# Patient Record
Sex: Female | Born: 1955 | Race: Black or African American | Hispanic: No | Marital: Single | Smoking: Never smoker
Health system: Southern US, Community
[De-identification: ages and names within clinical notes are randomized; demographics above are authoritative.]

## PROBLEM LIST (undated history)

## (undated) DIAGNOSIS — I10 Essential (primary) hypertension: Secondary | ICD-10-CM

## (undated) DIAGNOSIS — L509 Urticaria, unspecified: Secondary | ICD-10-CM

## (undated) DIAGNOSIS — C801 Malignant (primary) neoplasm, unspecified: Secondary | ICD-10-CM

## (undated) HISTORY — DX: Urticaria, unspecified: L50.9

## (undated) HISTORY — PX: BREAST SURGERY: SHX581

---

## 1998-04-29 ENCOUNTER — Other Ambulatory Visit: Admission: RE | Admit: 1998-04-29 | Discharge: 1998-04-29 | Payer: Self-pay | Admitting: Hematology and Oncology

## 2000-03-04 ENCOUNTER — Other Ambulatory Visit: Admission: RE | Admit: 2000-03-04 | Discharge: 2000-03-04 | Payer: Self-pay | Admitting: Family Medicine

## 2000-09-12 ENCOUNTER — Encounter: Admission: RE | Admit: 2000-09-12 | Discharge: 2000-09-12 | Payer: Self-pay | Admitting: Hematology and Oncology

## 2000-09-12 ENCOUNTER — Encounter: Payer: Self-pay | Admitting: Hematology and Oncology

## 2002-09-09 ENCOUNTER — Emergency Department (HOSPITAL_COMMUNITY): Admission: EM | Admit: 2002-09-09 | Discharge: 2002-09-09 | Payer: Self-pay | Admitting: *Deleted

## 2002-09-09 ENCOUNTER — Encounter: Payer: Self-pay | Admitting: Emergency Medicine

## 2002-11-29 ENCOUNTER — Other Ambulatory Visit: Admission: RE | Admit: 2002-11-29 | Discharge: 2002-11-29 | Payer: Self-pay | Admitting: Obstetrics and Gynecology

## 2005-10-15 ENCOUNTER — Other Ambulatory Visit: Admission: RE | Admit: 2005-10-15 | Discharge: 2005-10-15 | Payer: Self-pay | Admitting: Obstetrics and Gynecology

## 2007-07-06 ENCOUNTER — Encounter: Admission: RE | Admit: 2007-07-06 | Discharge: 2007-07-06 | Payer: Self-pay | Admitting: Obstetrics and Gynecology

## 2007-07-10 ENCOUNTER — Ambulatory Visit (HOSPITAL_BASED_OUTPATIENT_CLINIC_OR_DEPARTMENT_OTHER): Admission: RE | Admit: 2007-07-10 | Discharge: 2007-07-11 | Payer: Self-pay | Admitting: Specialist

## 2007-07-10 ENCOUNTER — Encounter (INDEPENDENT_AMBULATORY_CARE_PROVIDER_SITE_OTHER): Payer: Self-pay | Admitting: Specialist

## 2007-11-02 ENCOUNTER — Emergency Department (HOSPITAL_COMMUNITY): Admission: EM | Admit: 2007-11-02 | Discharge: 2007-11-02 | Payer: Self-pay | Admitting: Emergency Medicine

## 2009-09-08 ENCOUNTER — Emergency Department (HOSPITAL_COMMUNITY): Admission: EM | Admit: 2009-09-08 | Discharge: 2009-09-08 | Payer: Self-pay | Admitting: Emergency Medicine

## 2009-11-12 ENCOUNTER — Ambulatory Visit (HOSPITAL_COMMUNITY): Admission: RE | Admit: 2009-11-12 | Discharge: 2009-11-12 | Payer: Self-pay | Admitting: General Surgery

## 2010-08-10 IMAGING — RF DG CHOLANGIOGRAM OPERATIVE
1 series · 9 of 9 positions shown · non-contrast
Comparison: None

CLINICAL DATA: Cholelithiasis

INTRAOPERATIVE CHOLANGIOGRAM
TECHNIQUE: Cholangiographic images from the C-arm fluoroscopic
device were submitted for interpretation post-operatively.  Please
see the procedural report for the amount of contrast and the
fluoroscopy time utilized.

[Series 1: run · 3 acquisitions, 9 frames shown]
[im 1/3]
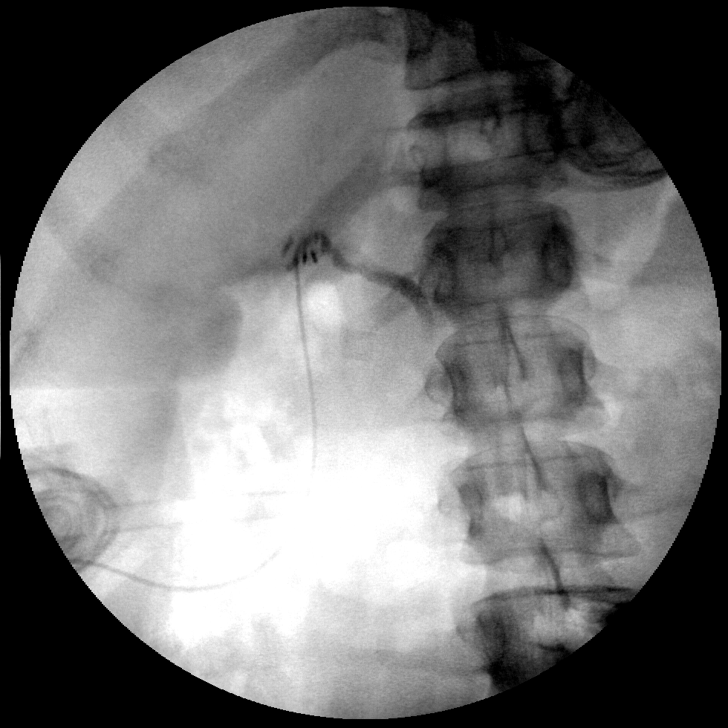
[im 1/3]
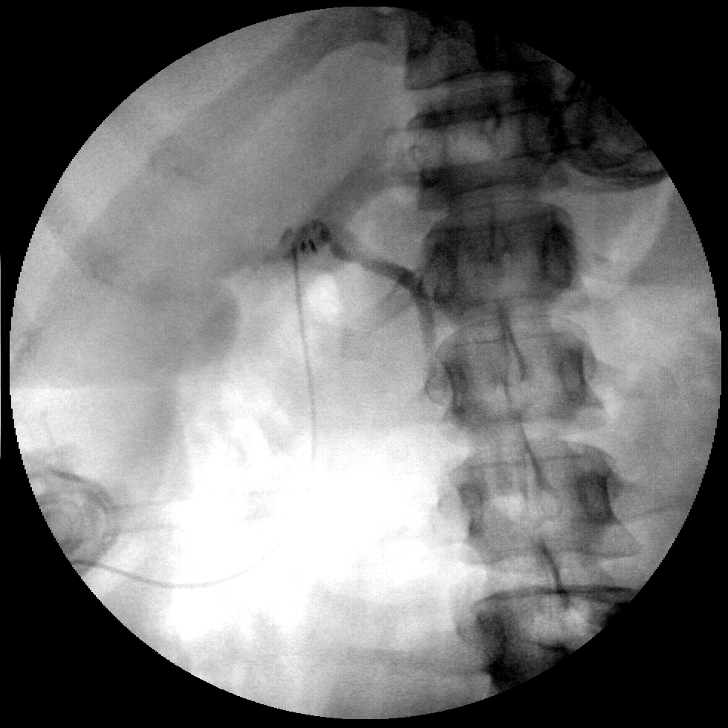
[im 1/3]
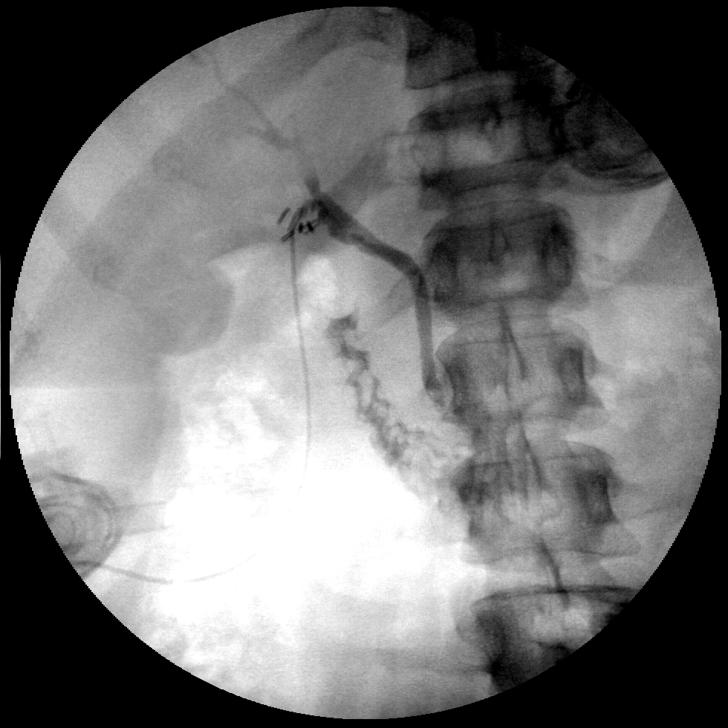
[im 1/3]
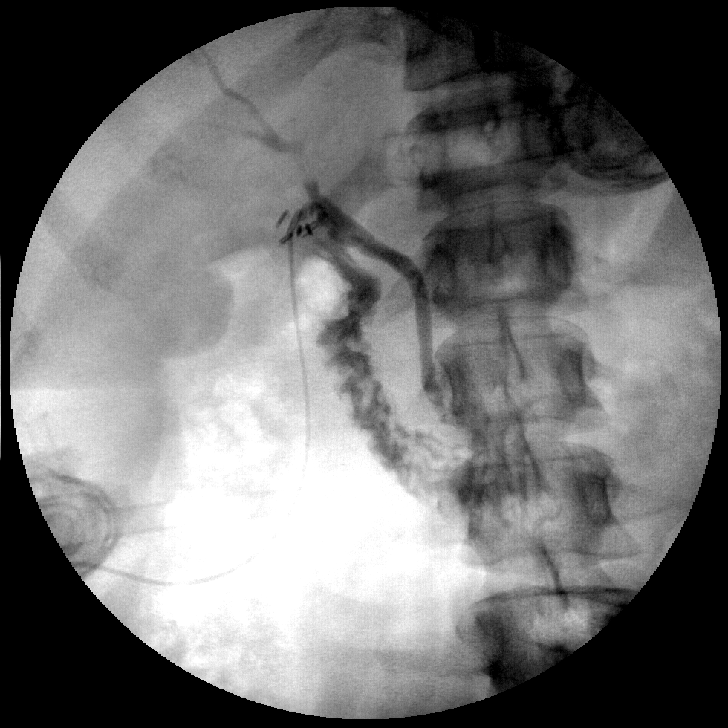
[im 2/3]
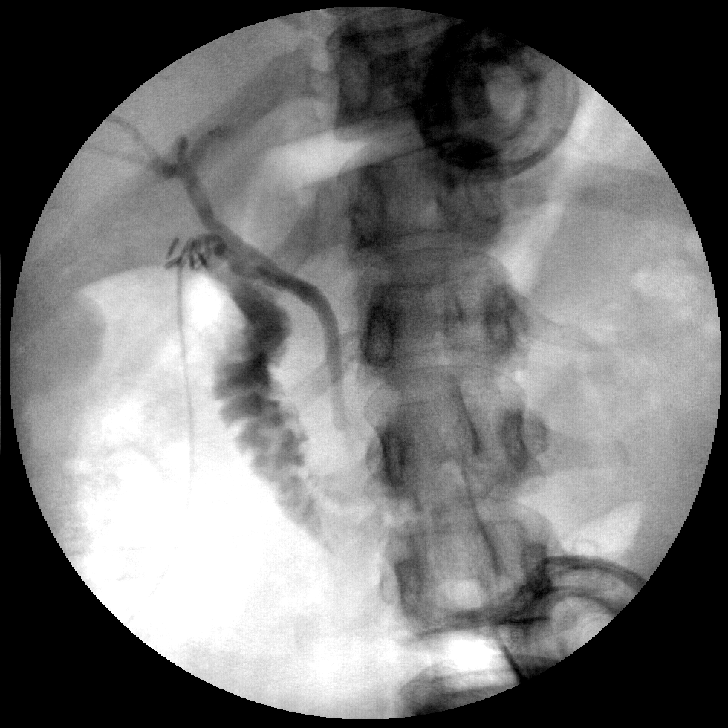
[im 2/3]
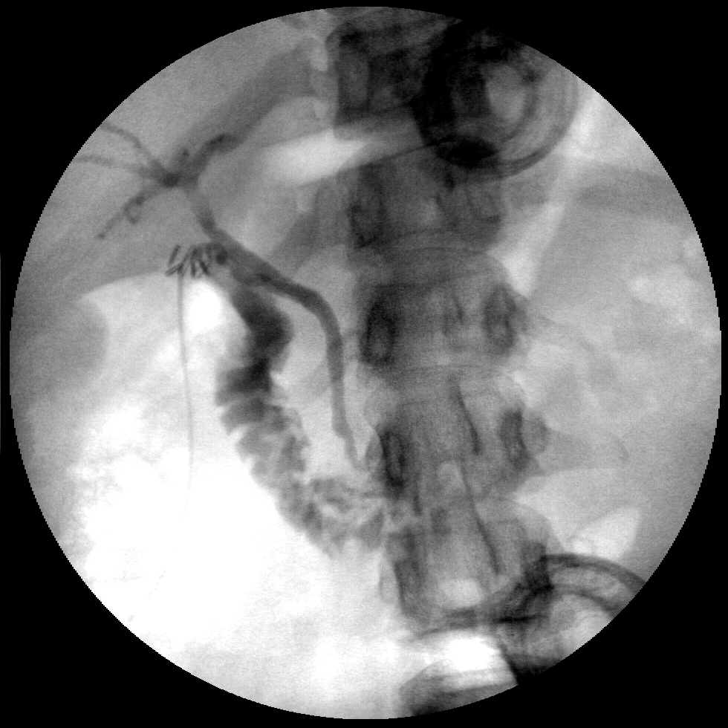
[im 2/3]
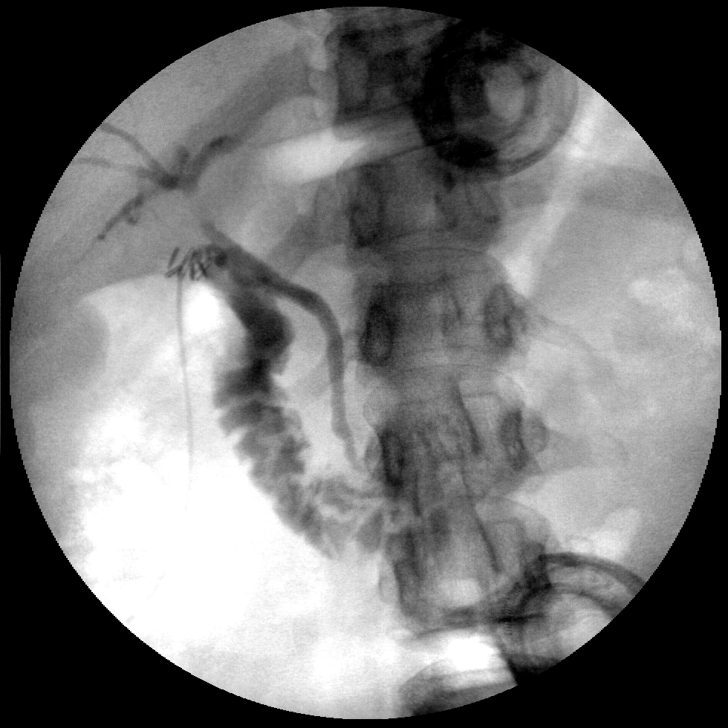
[im 2/3]
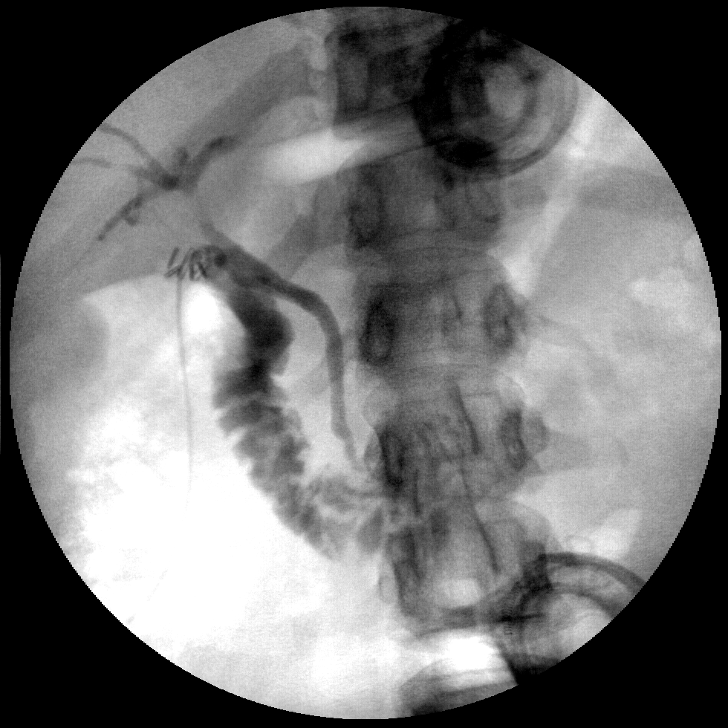
[im 3/3]
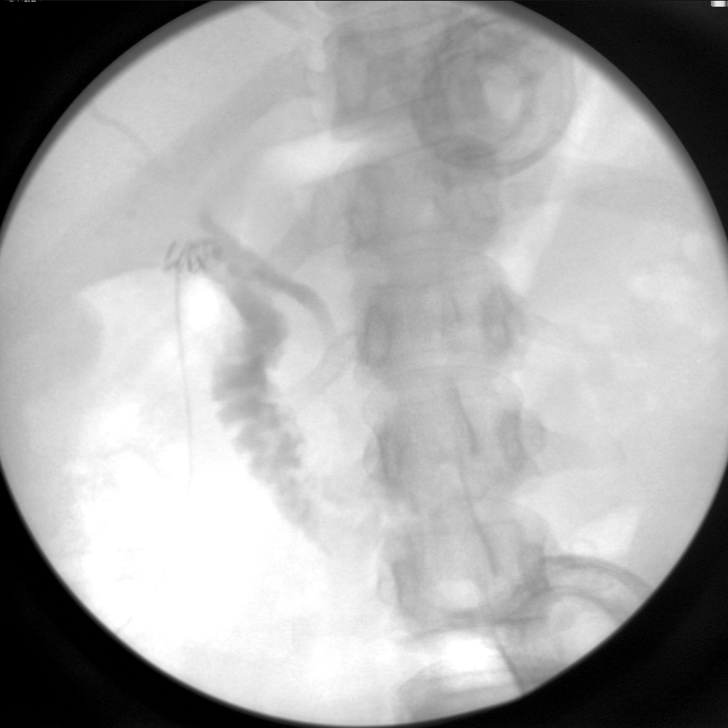

[9 of 9 positions shown; findings below may reference images not displayed]

FINDINGS: No persistent filling defects in the common duct.
Intrahepatic ducts are incompletely visualized, appearing
decompressed centrally. Contrast passes into the duodenum.

IMPRESSION

Negative for retained common duct stone.

## 2010-08-10 IMAGING — CR DG CHEST 2V
2 series · 2 of 2 positions shown · non-contrast
Comparison: 11/02/2007.

CLINICAL DATA: Preop for gallstones.

CHEST - 2 VIEW

[w chest pa]
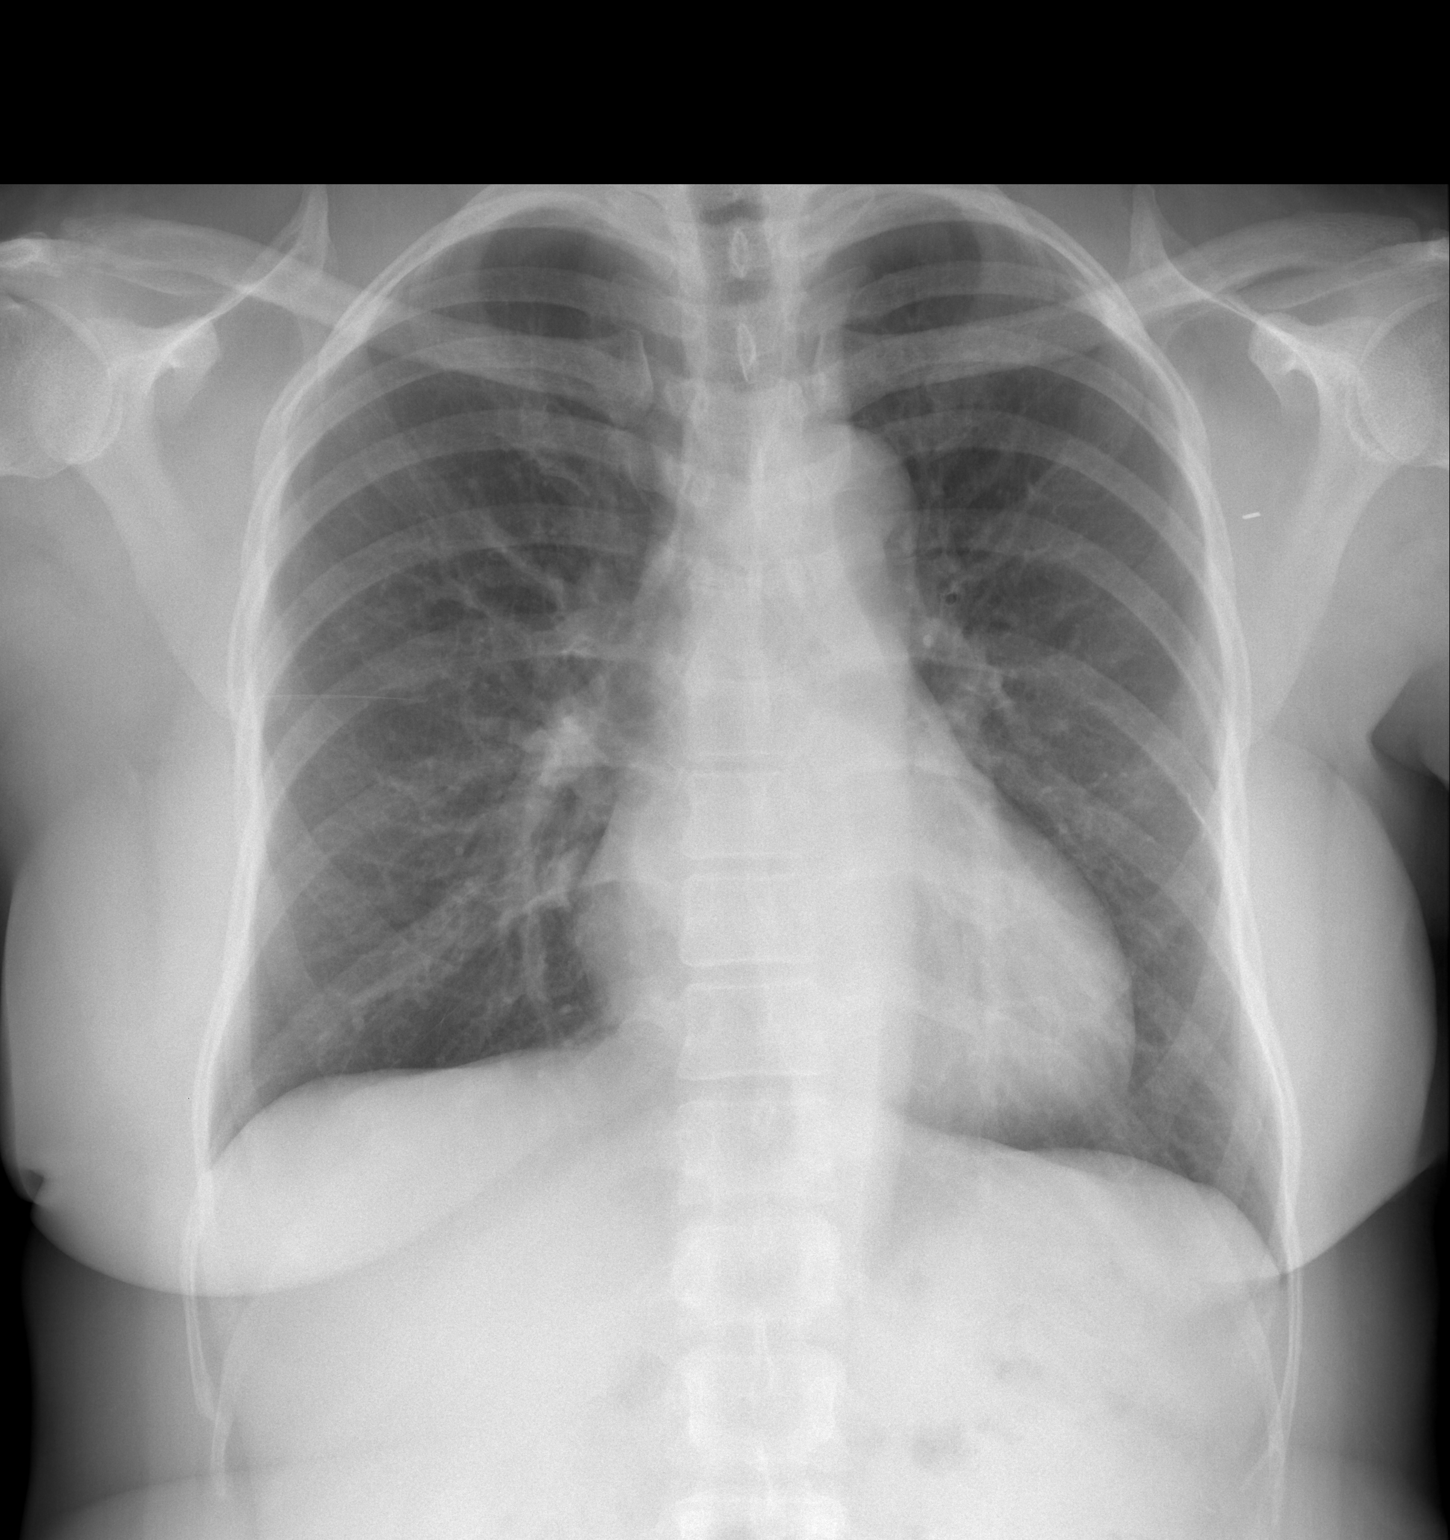

[w chest lat]
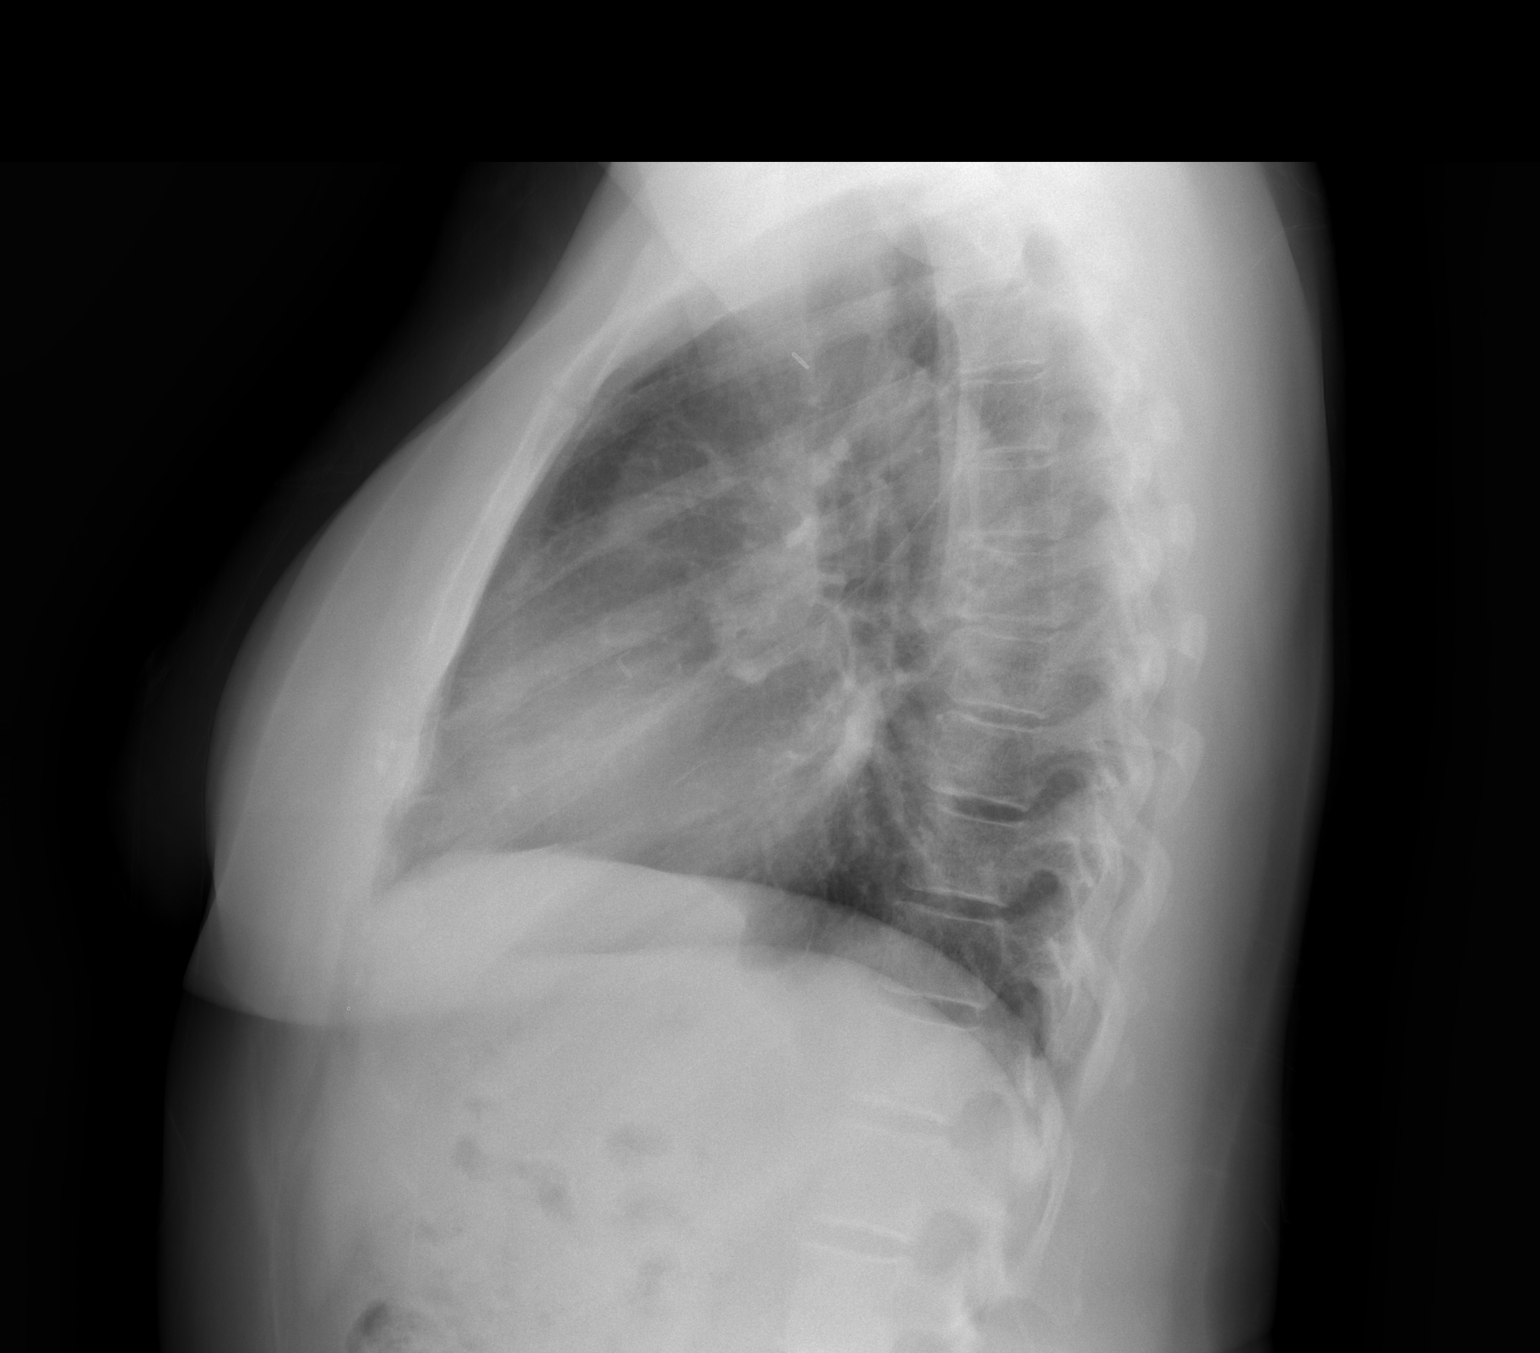

[2 of 2 positions shown; findings below may reference images not displayed]

FINDINGS: Heart size upper normal.  Lungs clear.  No pleural fluid
or osseous lesions.
IMPRESSION: The heart size upper normal - no active disease.

## 2011-03-24 LAB — COMPREHENSIVE METABOLIC PANEL
AST: 17 U/L (ref 0–37)
Albumin: 3.5 g/dL (ref 3.5–5.2)
Alkaline Phosphatase: 109 U/L (ref 39–117)
Chloride: 106 mEq/L (ref 96–112)
GFR calc non Af Amer: >60 mL/min (ref >60)
Glucose, Bld: 106 mg/dL — ABNORMAL HIGH (ref 70–99)
Total Protein: 6.9 g/dL (ref 6.0–8.3)

## 2011-03-24 LAB — DIFFERENTIAL
Basophils Absolute: 0 10*3/uL (ref 0.0–0.1)
Basophils Relative: 1 % (ref 0–1)
Eosinophils Absolute: 0.1 10*3/uL (ref 0.0–0.7)
Eosinophils Relative: 2 % (ref 0–5)
Lymphocytes Relative: 34 % (ref 12–46)
Lymphs Abs: 1.7 10*3/uL (ref 0.7–4.0)
Monocytes Absolute: 0.3 10*3/uL (ref 0.1–1.0)
Monocytes Relative: 6 % (ref 3–12)

## 2011-03-24 LAB — CBC
Hemoglobin: 11.1 g/dL — ABNORMAL LOW (ref 12.0–15.0)
MCV: 83.5 fL (ref 78.0–100.0)
RBC: 4.06 MIL/uL (ref 3.87–5.11)
RDW: 13.5 % (ref 11.5–15.5)

## 2011-03-26 LAB — DIFFERENTIAL
Basophils Absolute: 0 10*3/uL (ref 0.0–0.1)
Eosinophils Absolute: 0 10*3/uL (ref 0.0–0.7)
Eosinophils Relative: 0 % (ref 0–5)
Lymphs Abs: 1.3 10*3/uL (ref 0.7–4.0)
Monocytes Absolute: 0.4 10*3/uL (ref 0.1–1.0)
Monocytes Relative: 4 % (ref 3–12)

## 2011-03-26 LAB — COMPREHENSIVE METABOLIC PANEL
AST: 124 U/L — ABNORMAL HIGH (ref 0–37)
CO2: 28 mEq/L (ref 19–32)
GFR calc Af Amer: >60 mL/min (ref >60)
Glucose, Bld: 111 mg/dL — ABNORMAL HIGH (ref 70–99)
Sodium: 141 mEq/L (ref 135–145)
Total Protein: 7.2 g/dL (ref 6.0–8.3)

## 2011-03-26 LAB — POCT CARDIAC MARKERS: Myoglobin, poc: 55.5 ng/mL (ref 12–200)

## 2011-03-26 LAB — CBC
HCT: 34.6 % — ABNORMAL LOW (ref 36.0–46.0)
MCV: 85.1 fL (ref 78.0–100.0)

## 2011-03-26 LAB — LIPASE, BLOOD: Lipase: 41 U/L (ref 11–59)

## 2011-05-04 NOTE — Op Note (Signed)
NAME:  Kristin Galloway, Kristin Galloway             ACCOUNT NO.:  [id number]   MEDICAL RECORD NO.:  [id number]          PATIENT TYPE:  AMB   LOCATION:  DSC                          FACILITY:  [facility]   PHYSICIAN:  Earvin Hansen L. [place]es[place], M.D.DATE OF BIRTH:  [birth year]   DATE OF PROCEDURE:  DATE OF DISCHARGE:                               OPERATIVE REPORT   HISTORY:  The patient is 55 years old, status post left mastectomy for  breast cancer approximately 10 years ago and received postop  irradiation, as well as chemotherapy.  The patient over the last few  months has consulted me in regards to reconstruction on the left side.  The mastectomy area has healed well without any problems.  The skin is  actually very supple compared to most skin that has had the irradiation.  The right breast shows increased macromastia and drooping.   PROCEDURES:  1. Lifting and reduction of the right breast with reduction      mammoplasty.  2. Reconstruction of the left mastectomy area with a tissue expander.   ANESTHESIA:  General.   OPERATIVE PROCEDURE:  Preoperatively, the patient was set up and drawn  for the right reduction using inferior pedicle markings and the left  tissue expander using inframammary fold congruent with the right side.  She then underwent general anesthesia, intubated orally.  Prep was done  to the chest breast areas in a routine fashion using Hibiclens soap and  solution and walled off with sterile towels and drapes, so as to make a  sterile field.  1/4% Xylocaine with epinephrine was injected locally in  the right breast area for vasoconstriction.  The wounds were scored with  #15 blade and then the skin over the inferior pedicle was de-epithelized  with #2 blades.  Medial and lateral fascia, derma and pedicles were  excised.  A new keyhole area was also excised and flaps were transposed  and stayed with 3-0 Prolene.  This gave excellent symmetry and also we  removed some accessory breast tissue  in the right lateral portion that  caused a fold area preoperatively.  After this, the wounds were closed  in layers with 2-0 Monocryl x2 layers.  A running subcuticular stitch of  3-0 Monocryl and 5-0 Monocryl throughout the inverted T.  The wound was  drained with a #10 Blake drain fully floated, which was placed in the  depths of the wound and brought out through the lateral most portion of  the incision and secured with 3-0 Prolene.  Next, attention was drawn to  the left side.  Previous transverse incision was opened.  After the area  was anesthetized with 1/4% Xylocaine with epinephrine 100 mL, I was able  to dissect down to the pectoralis major muscle.  A subpectoral plane was  then developed.  Using a finger dissection, I was able to dissect the  superior and medial portions; however, distally we used light retraction  and dissected over the 5th rib to go down to the previously drawn  inframammary fold and out laterally.  We were able to remove scar tissue  and dissect out  the whole space.  The pocket was examined.  Hemostasis  was maintained with the Bovie unit of coagulation.  Next, a Mentor  Spectrum tissue expander was placed into the space with good symmetry  and approximately 150 mL of saline was injected.  We also put a port in  laterally at the axillary region and coupled it together with the tubing  from the tissue expander.  This was sutured in with 3-0 Monocryl.  Next,  after proper hemostasis, the muscle was then re-closed with 2-0  Monocryl, subcutaneous tissue with 3-0 Monocryl x2 layers, then a  running subcuticular stitch of 3-0 Monocryl.  The wounds were drained  with a #10 floated Blake drain, which was placed in the depths of the  wound and brought out laterally and secured with 3-0 Prolene.  The  wounds were cleansed.  Steri-Strips were applied, 1/2-inch type soft  dressings to each side.  She withstood the procedures very well and was  taken to recovery in  excellent condition.   ESTIMATED BLOOD LOSS:  Less than 100 mL.   COMPLICATIONS:  None.      Yaakov Guthrie. Shon Hough, M.D.  Electronically Signed     GLT/MEDQ  D:  07/10/2007  T:  07/10/2007  Job:  [id number]

## 2011-09-28 LAB — URINALYSIS, ROUTINE W REFLEX MICROSCOPIC
Bilirubin Urine: NEGATIVE
Glucose, UA: NEGATIVE
Nitrite: NEGATIVE
pH: 7.5

## 2011-09-28 LAB — URINE MICROSCOPIC-ADD ON

## 2011-09-28 LAB — POCT URINE HEMOGLOBIN: Hgb urine dipstick: POSITIVE — AB

## 2011-10-04 LAB — I-STAT 8, (EC8 V) (CONVERTED LAB)
Acid-Base Excess: 3 — ABNORMAL HIGH
Bicarbonate: 29 — ABNORMAL HIGH
Glucose, Bld: 97
HCT: 39
Hemoglobin: 13.3
Operator id: 123881
Sodium: 142
pH, Ven: 7.395 — ABNORMAL HIGH

## 2011-10-04 LAB — BASIC METABOLIC PANEL
BUN: 15
Chloride: 105
Sodium: 140

## 2011-10-04 LAB — POCT HEMOGLOBIN-HEMACUE: Operator id: 123881

## 2014-02-08 ENCOUNTER — Emergency Department (HOSPITAL_COMMUNITY)
Admission: EM | Admit: 2014-02-08 | Discharge: 2014-02-08 | Disposition: A | Discharge status: Home / Self Care | Payer: BC Managed Care – PPO | Source: Home / Self Care | Attending: Family Medicine | Admitting: Family Medicine

## 2014-02-08 ENCOUNTER — Encounter (HOSPITAL_COMMUNITY): Payer: Self-pay | Admitting: Emergency Medicine

## 2014-02-08 DIAGNOSIS — R062 Wheezing: Secondary | ICD-10-CM

## 2014-02-08 DIAGNOSIS — T7840XA Allergy, unspecified, initial encounter: Secondary | ICD-10-CM

## 2014-02-08 HISTORY — DX: Essential (primary) hypertension: I10

## 2014-02-08 HISTORY — DX: Malignant (primary) neoplasm, unspecified: C80.1

## 2014-02-08 MED ORDER — IPRATROPIUM-ALBUTEROL 0.5-2.5 (3) MG/3ML IN SOLN
3.0000 mL | Freq: Once | RESPIRATORY_TRACT | Status: AC
  Administered 2014-02-08: 3 mL via RESPIRATORY_TRACT

## 2014-02-08 MED ORDER — METHYLPREDNISOLONE ACETATE 80 MG/ML IJ SUSP
80.0000 mg | Freq: Once | INTRAMUSCULAR | Status: AC
  Administered 2014-02-08: 80 mg via INTRAMUSCULAR

## 2014-02-08 MED ORDER — METHYLPREDNISOLONE ACETATE 80 MG/ML IJ SUSP
INTRAMUSCULAR | Status: AC
  Filled 2014-02-08: qty 1

## 2014-02-08 MED ORDER — ALBUTEROL SULFATE HFA 108 (90 BASE) MCG/ACT IN AERS: 1.0000 | INHALATION_SPRAY | Freq: Four times a day (QID) | RESPIRATORY_TRACT | Status: AC | PRN

## 2014-02-08 MED ORDER — IPRATROPIUM-ALBUTEROL 0.5-2.5 (3) MG/3ML IN SOLN
RESPIRATORY_TRACT | Status: AC
  Filled 2014-02-08: qty 3

## 2014-02-08 NOTE — Discharge Instructions (Signed)
Drug Allergy Allergic reactions to medicines are common. Some allergic reactions are mild. A delayed type of drug allergy that occurs 1 week or more after exposure to a medicine or vaccine is called serum sickness. A life-threatening, sudden (acute) allergic reaction that involves the whole body is called anaphylaxis. CAUSES  "True" drug allergies occur when there is an allergic reaction to a medicine. This is caused by overactivity of the immune system. First, the body becomes sensitized. The immune system is triggered by your first exposure to the medicine. Following this first exposure, future exposure to the same medicine may be life-threatening. Almost any medicine can cause an allergic reaction. Common ones are:  Penicillin.  Sulfonamides (sulfa drugs).  Local anesthetics.  X-ray dyes that contain iodine. SYMPTOMS  Common symptoms of a minor allergic reaction are:  Swelling around the mouth.  An itchy red rash or hives.  Vomiting or diarrhea. Anaphylaxis can cause swelling of the mouth and throat. This makes it difficult to breathe and swallow. Severe reactions can be fatal within seconds, even after exposure to only a trace amount of the drug that causes the reaction. HOME CARE INSTRUCTIONS   If you are unsure of what caused your reaction, keep a diary of foods and medicines used. Include the symptoms that followed. Avoid anything that causes reactions.  You may want to follow up with an allergy specialist after the reaction has cleared in order to be tested to confirm the allergy. It is important to confirm that your reaction is an allergy, not just a side effect to the medicine. If you have a true allergy to a medicine, this may prevent that medicine and related medicines from being given to you when you are very ill.  If you have hives or a rash:  Take medicines as directed by your caregiver.  You may use an over-the-counter antihistamine (diphenhydramine) as  needed.  Apply cold compresses to the skin or take baths in cool water. Avoid hot baths or showers.  If you are severely allergic:  Continuous observation after a severe reaction may be needed. Hospitalization is often required.  Wear a medical alert bracelet or necklace stating your allergy.  You and your family must learn how to use an anaphylaxis kit or give an epinephrine injection to temporarily treat an emergency allergic reaction. If you have had a severe reaction, always carry your epinephrine injection or anaphylaxis kit with you. This can be lifesaving if you have a severe reaction.  Do not drive or perform tasks after treatment until the medicines used to treat your reaction have worn off, or until your caregiver says it is okay. SEEK MEDICAL CARE IF:   You think you had an allergic reaction. Symptoms usually start within 30 minutes after exposure.  Symptoms are getting worse rather than better.  You develop new symptoms.  The symptoms that brought you to your caregiver return. SEEK IMMEDIATE MEDICAL CARE IF:   You have swelling of the mouth, difficulty breathing, or wheezing.  You have a tight feeling in your chest or throat.  You develop hives, swelling, or itching all over your body.  You develop severe vomiting or diarrhea.  You feel faint or pass out. This is an emergency. Use your epinephrine injection or anaphylaxis kit as you have been instructed. Call for emergency medical help. Even if you improve after the injection, you need to be examined at a hospital emergency department. MAKE SURE YOU:   Understand these instructions.  Will watch  your condition.  Will get help right away if you are not doing well or get worse. Document Released: 12/06/2005 Document Revised: 02/28/2012 Document Reviewed: 05/12/2011 [facility] Patient Information 2014 [place], [place].   Take Benadryl every 4 hours $Remov'25mg'BffgCr$ , for the next 24 hours. Steroid will help as well. Use inhaler  every 6 hours for the next 24-36 hours then as needed. F/U with Dr. Criss Rosales

## 2014-02-08 NOTE — ED Notes (Signed)
Allergic reactionx 2 hours: throat tightening, chest tightening, history of the same.  Not sure what caused this episode

## 2014-02-08 NOTE — ED Provider Notes (Signed)
CSN: [id number]     Arrival date & time 02/08/14  1609 History   First MD Initiated Contact with Patient 02/08/14 1658     Chief Complaint  Patient presents with  . Allergic Reaction     (Consider location/radiation/quality/duration/timing/severity/associated sxs/prior Treatment) HPI Comments: Patient presents today with mild dyspnea and hoarseness following a questionable exposure to a food. She has a known history of drug allergy, and she reports at times she has a reaction with certain fumes. She questions what exactly she was exposed to but she started to feel SOB. Denies rash, cough, or dizziness. She has already taken 50mg  or oral Benadryl which she states does help. She denies palpitations.   Patient is a 58 y.o. female presenting with allergic reaction. The history is provided by the patient.  Allergic Reaction   Past Medical History  Diagnosis Date  . Hypertension   . Cancer    Past Surgical History  Procedure Laterality Date  . Breast surgery     No family history on file. History  Substance Use Topics  . Smoking status: Never Smoker   . Smokeless tobacco: Not on file  . Alcohol Use: No   OB History   Grav Para Term Preterm Abortions TAB SAB Ect Mult Living                 Review of Systems  All other systems reviewed and are negative.      Allergies  Other  Home Medications   Current Outpatient Rx  Name  Route  Sig  Dispense  Refill  . diphenhydrAMINE (BENADRYL) 25 MG tablet   Oral   Take 25 mg by mouth every 6 (six) hours as needed.         Marland Kitchen OVER THE COUNTER MEDICATION      Blood pressure medicine         . albuterol (PROVENTIL HFA;VENTOLIN HFA) 108 (90 BASE) MCG/ACT inhaler   Inhalation   Inhale 1-2 puffs into the lungs every 6 (six) hours as needed for wheezing or shortness of breath (Use every 6 hours for 24 hour-36 hours then prn).   1 Inhaler   0    BP 192/98  Pulse 90  Temp(Src) 98.5 F (36.9 C) (Oral)  Resp 18  SpO2  98% Physical Exam  Nursing note and vitals reviewed. Constitutional: She is oriented to person, place, and time. She appears well-developed and well-nourished. No distress.  Patient does not appear in respiratory distress and breathing comfortably. She does have a hoarseness to her voice though making complete sentences.   HENT:  Head: Normocephalic and atraumatic.  Mouth/Throat: No oropharyngeal exudate.  Neck: Normal range of motion. Neck supple. No JVD present. No tracheal deviation present. No thyromegaly present.  Cardiovascular: Normal rate, regular rhythm and normal heart sounds.   Pulmonary/Chest: Effort normal. No stridor. She has wheezes.  Mild basilar wheezing  Neurological: She is alert and oriented to person, place, and time. No cranial nerve deficit.  Skin: Skin is warm and dry. No rash noted. She is not diaphoretic.  Psychiatric: Her behavior is normal. Judgment normal.    ED Course  Procedures (including critical care time) Labs Review Labs Reviewed - No data to display Imaging Review No results found.    MDM   Final diagnoses:  Allergic reaction  Wheezing    Mild, but noted wheeze throughout. Sats stable. Treated acutely with Neb, steroid IM and Benadryl q 4 hours for the next 24-36 hours. Inhaler  q 6 hours for the next 24-36 hours as well.  F/U with PCP.      Bjorn Pippin, PA-C 02/08/14 1730

## 2014-02-10 NOTE — ED Provider Notes (Signed)
Medical screening examination/treatment/procedure(s) were performed by a resident physician or non-physician practitioner and as the supervising physician I was immediately available for consultation/collaboration.  Lynne Leader, MD    Gregor Hams, MD 02/10/14 [phone]

## 2014-08-16 ENCOUNTER — Other Ambulatory Visit: Payer: Self-pay | Admitting: Gastroenterology

## 2018-03-09 ENCOUNTER — Other Ambulatory Visit: Payer: Self-pay | Admitting: Family Medicine

## 2018-03-09 DIAGNOSIS — N6310 Unspecified lump in the right breast, unspecified quadrant: Secondary | ICD-10-CM

## 2018-03-31 ENCOUNTER — Ambulatory Visit
Admission: RE | Admit: 2018-03-31 | Discharge: 2018-03-31 | Disposition: A | Discharge status: Home / Self Care | Payer: BC Managed Care – PPO | Source: Ambulatory Visit | Attending: Family Medicine | Admitting: Family Medicine

## 2018-03-31 ENCOUNTER — Other Ambulatory Visit: Payer: Self-pay | Admitting: Family Medicine

## 2018-03-31 DIAGNOSIS — N6310 Unspecified lump in the right breast, unspecified quadrant: Secondary | ICD-10-CM

## 2018-04-06 ENCOUNTER — Ambulatory Visit
Admission: RE | Admit: 2018-04-06 | Discharge: 2018-04-06 | Disposition: A | Discharge status: Home / Self Care | Payer: BC Managed Care – PPO | Source: Ambulatory Visit | Attending: Family Medicine | Admitting: Family Medicine

## 2018-04-06 DIAGNOSIS — N6310 Unspecified lump in the right breast, unspecified quadrant: Secondary | ICD-10-CM

## 2018-07-04 ENCOUNTER — Other Ambulatory Visit: Payer: Self-pay | Admitting: Family Medicine

## 2018-07-04 DIAGNOSIS — R5381 Other malaise: Secondary | ICD-10-CM

## 2018-07-14 ENCOUNTER — Other Ambulatory Visit: Payer: Self-pay | Admitting: Family Medicine

## 2018-07-14 DIAGNOSIS — R922 Inconclusive mammogram: Secondary | ICD-10-CM

## 2019-05-26 ENCOUNTER — Other Ambulatory Visit: Payer: Self-pay | Admitting: *Deleted

## 2019-05-26 DIAGNOSIS — Z20822 Contact with and (suspected) exposure to covid-19: Secondary | ICD-10-CM

## 2019-05-28 LAB — NOVEL CORONAVIRUS, NAA: SARS-CoV-2, NAA: NOT DETECTED

## 2019-06-30 ENCOUNTER — Other Ambulatory Visit: Payer: Self-pay

## 2019-06-30 DIAGNOSIS — Z20822 Contact with and (suspected) exposure to covid-19: Secondary | ICD-10-CM

## 2019-07-06 LAB — NOVEL CORONAVIRUS, NAA: SARS-CoV-2, NAA: NOT DETECTED

## 2020-02-21 ENCOUNTER — Ambulatory Visit: Payer: BC Managed Care – PPO | Attending: Family

## 2020-02-21 DIAGNOSIS — Z23 Encounter for immunization: Secondary | ICD-10-CM | POA: Insufficient documentation

## 2020-02-21 NOTE — Progress Notes (Signed)
   Covid-19 Vaccination Clinic  Name:  Kristin Galloway    MRN: [id number] DOB: 1956/04/09  02/21/2020  [name] was observed post Covid-19 immunization for 15 minutes without incident. She was provided with Vaccine Information Sheet and instruction to access the V-Safe system.   [name] was instructed to call 911 with any severe reactions post vaccine: Marland Kitchen Difficulty breathing  . Swelling of face and throat  . A fast heartbeat  . A bad rash all over body  . Dizziness and weakness   Immunizations Administered    Name Date Dose VIS Date Route   Moderna COVID-19 Vaccine 02/21/2020  3:13 PM 0.5 mL 11/20/2019 Intramuscular   Manufacturer: Moderna   Lot: [id number]   [id number]

## 2020-03-25 ENCOUNTER — Ambulatory Visit: Payer: BC Managed Care – PPO | Attending: Family

## 2020-03-25 DIAGNOSIS — Z23 Encounter for immunization: Secondary | ICD-10-CM

## 2020-03-25 NOTE — Progress Notes (Signed)
   Covid-19 Vaccination Clinic  Name:  Kailene Stooksbury    MRN: [id number] DOB: [birth year]  03/25/2020  Ms. Artus was observed post Covid-19 immunization for 15 minutes without incident. She was provided with Vaccine Information Sheet and instruction to access the V-Safe system.   Ms. Celestine was instructed to call 911 with any severe reactions post vaccine: Marland Kitchen Difficulty breathing  . Swelling of face and throat  . A fast heartbeat  . A bad rash all over body  . Dizziness and weakness   Immunizations Administered    Name Date Dose VIS Date Route   Moderna COVID-19 Vaccine 03/25/2020  2:32 PM 0.5 mL 11/20/2019 Intramuscular   Manufacturer: Moderna   Lot: [id number]   [place][id number]

## 2020-12-30 ENCOUNTER — Ambulatory Visit: Payer: Self-pay | Attending: Internal Medicine

## 2020-12-30 DIAGNOSIS — Z23 Encounter for immunization: Secondary | ICD-10-CM

## 2020-12-30 NOTE — Progress Notes (Signed)
   Covid-19 Vaccination Clinic  Name:  Kristin Galloway    MRN: [id number] DOB: 08/24/1956  12/30/2020  [name] was observed post Covid-19 immunization for 15 minutes without incident. She was provided with Vaccine Information Sheet and instruction to access the V-Safe system.   [name] was instructed to call 911 with any severe reactions post vaccine: Marland Kitchen Difficulty breathing  . Swelling of face and throat  . A fast heartbeat  . A bad rash all over body  . Dizziness and weakness   Immunizations Administered    Name Date Dose VIS Date Route   Moderna Covid-19 Booster Vaccine 12/30/2020  4:58 PM 0.25 mL 10/08/2020 Intramuscular   Manufacturer: Levan Hurst   Lot: [id number]   [place]: [id number]

## 2021-02-05 ENCOUNTER — Ambulatory Visit (INDEPENDENT_AMBULATORY_CARE_PROVIDER_SITE_OTHER): Payer: BC Managed Care – PPO | Admitting: Allergy

## 2021-02-05 ENCOUNTER — Other Ambulatory Visit: Payer: Self-pay

## 2021-02-05 ENCOUNTER — Encounter: Payer: Self-pay | Admitting: Allergy

## 2021-02-05 VITALS — BP 132/60 | HR 81 | Temp 97.6°F | Resp 14 | Ht 62.0 in | Wt 180.4 lb

## 2021-02-05 DIAGNOSIS — Z8709 Personal history of other diseases of the respiratory system: Secondary | ICD-10-CM | POA: Diagnosis not present

## 2021-02-05 DIAGNOSIS — L508 Other urticaria: Secondary | ICD-10-CM

## 2021-02-05 MED ORDER — CETIRIZINE HCL 10 MG PO TABS: 10.0000 mg | ORAL_TABLET | Freq: Two times a day (BID) | ORAL | 5 refills | Status: AC

## 2021-02-05 MED ORDER — MONTELUKAST SODIUM 10 MG PO TABS: 10.0000 mg | ORAL_TABLET | Freq: Every day | ORAL | 5 refills | Status: AC

## 2021-02-05 MED ORDER — FAMOTIDINE 20 MG PO TABS: 20.0000 mg | ORAL_TABLET | Freq: Two times a day (BID) | ORAL | 5 refills | Status: AC

## 2021-02-05 NOTE — Progress Notes (Addendum)
New Patient Note  RE: Kristin Galloway MRN: [id number] DOB: [birth year] Date of Office Visit: 02/05/2021  Referring provider: No ref. provider found Primary care provider: Lucianne Lei, MD  Chief Complaint: hives  History of present illness: Kristin Galloway is a 65 y.o. female presenting today for evaluation of hives.  She has been having hives since January 2021and occurs daily anywhere on her body.  She has been using neosporin on areas of hives and states the hive will usually be gone within 30 minutes.  Hives do not leave any marks or bruising behind once resolved.  She had noted that her hands and feet do feel tight at times.  No joint aches/pains with the rash.  No preceding illness.  No changes in her medications or diet.  No changes in her soaps/detergents/body products.  No stings.  Has used benadryl as needed.  She was prescribed prednisone to complete a 20 day course of which she has about 8 days left at this time.  She does states the hives on her abdomen are less with the prednisone on board.    She does report fragrances can cause her to have issues with talking and breathing and feeling nauseous.    No history of asthma however she was prescribed albuterol during a respiratory illness.  She states she may only use it if she is having difficulty breathing or shortness of breath mostly from doing an activity.  No concern at this time for seasonal or perennial allergic rhinoconjunctivitis.  She has no history of eczema.  Review of systems: Review of Systems  Constitutional: Negative.   HENT: Negative.   Eyes: Negative.   Respiratory: Negative.   Cardiovascular: Negative.   Gastrointestinal: Negative.   Musculoskeletal: Negative.   Skin: Positive for itching and rash.  Neurological: Negative.     All other systems negative unless noted above in HPI  Past medical history: Past Medical History:  Diagnosis Date  . Cancer ([place])   . Hypertension   . Urticaria      Past surgical history: Past Surgical History:  Procedure Laterality Date  . BREAST SURGERY      Family history:  Family History  Problem Relation Age of Onset  . Urticaria Sister   . Thyroid disease Sister     Social history: She lives in a home with carpeting with heat pump heating and central cooling.  No pets in the home.  There is no concern for water damage, mildew or roaches in the home.  She is a Scientist, physiological.  She denies a smoking history.  Medication List: Current Outpatient Medications  Medication Sig Dispense Refill  . albuterol (PROVENTIL HFA;VENTOLIN HFA) 108 (90 BASE) MCG/ACT inhaler Inhale 1-2 puffs into the lungs every 6 (six) hours as needed for wheezing or shortness of breath (Use every 6 hours for 24 hour-36 hours then prn). 1 Inhaler 0  . amlodipine-olmesartan (AZOR) 10-20 MG tablet Take 1 tablet by mouth daily.    . diphenhydrAMINE (BENADRYL) 25 MG tablet Take 25 mg by mouth every 6 (six) hours as needed.    Marland Kitchen OVER THE COUNTER MEDICATION Blood pressure medicine     No current facility-administered medications for this visit.    Known medication allergies: Allergies  Allergen Reactions  . Other     Not sure: some foods pollutants     Physical examination: Blood pressure 132/60, pulse 81, temperature 97.6 F (36.4 C), resp. rate 14, height 5\' 2"  (1.575 m), weight 180  lb 6.4 oz (81.8 kg), SpO2 100 %.  General: Alert, interactive, in no acute distress. HEENT: PERRLA, TMs pearly gray, turbinates non-edematous without discharge, post-pharynx non erythematous. Neck: Supple without lymphadenopathy. Lungs: Clear to auscultation without wheezing, rhonchi or rales. {no increased work of breathing. CV: Normal S1, S2 without murmurs. Abdomen: Nondistended, nontender. Skin: Scattered erythematous urticarial type lesions primarily located In the antecubital fossa area of the right arm. , nonvesicular. Extremities:  No clubbing, cyanosis or  edema. Neuro:   Grossly intact.  Diagnositics/Labs: Positive for dermatographia.  Patient scratched her arm in a welt developed at the stroke site  Spirometry: FEV1 1.4L 78%, FVC 1.57L 68% predicted. Restrictive appearing pattern   Assessment and plan:   Chronic urticaria - at this time etiology of hives and swelling is unknown but most likely spontaneous in nature.  Hives can be caused by a variety of different triggers including illness/infection, foods, medications, stings, exercise, pressure, vibrations, extremes of temperature to name a few however majority of the time there is no identifiable trigger.  Your symptoms have been ongoing for >6 weeks making this chronic thus will obtain labwork to evaluate: CBC w diff, CMP, tryptase, hive panel, environmental panel, alpha-gal panel, inflammatory markers - for hive management recommend the following high-dose antihistamine regimen: Zyrtec 10mg  or Allegra 180mg  1 tab twice a day with Pepcid 20mg  1 tab twice a day and Singulair 10mg  1 tab daily at bedtime -if this high-dose antihistamine regimen is not effective enough then recommend starting Xolair monthly injections to help control hives not responsive enough to antihistamines. We will discuss this further if needed at future visit.   History of bronchitis/viral illness -have access to albuterol inhaler 2 puffs every 4-6 hours as needed for cough/wheeze/shortness of breath/chest tightness.  May use 15-20 minutes prior to activity.   Monitor frequency of use.     Follow-up in 2-3 months or sooner if needed  I appreciate the opportunity to take part in Aalaiyah's care. Please do not hesitate to contact me with questions.  Sincerely,   Prudy Feeler, MD Allergy/Immunology [facility] [place] [facility] [place]

## 2021-02-05 NOTE — Patient Instructions (Signed)
Chronic hives - at this time etiology of hives and swelling is unknown but most likely spontaneous in nature.  Hives can be caused by a variety of different triggers including illness/infection, foods, medications, stings, exercise, pressure, vibrations, extremes of temperature to name a few however majority of the time there is no identifiable trigger.  Your symptoms have been ongoing for >6 weeks making this chronic thus will obtain labwork to evaluate: CBC w diff, CMP, tryptase, hive panel, environmental panel, alpha-gal panel, inflammatory markers - for hive management recommend the following high-dose antihistamine regimen: Zyrtec 10mg  or Allegra 180mg  1 tab twice a day with Pepcid 20mg  1 tab twice a day and Singulair 10mg  1 tab daily at bedtime -if this high-dose antihistamine regimen is not effective enough then recommend starting Xolair monthly injections to help control hives not responsive enough to antihistamines. We will discuss this further if needed at future visit.   History of bronchitis/viral illness -have access to albuterol inhaler 2 puffs every 4-6 hours as needed for cough/wheeze/shortness of breath/chest tightness.  May use 15-20 minutes prior to activity.   Monitor frequency of use.     Follow-up in 2-3 months or sooner if needed

## 2021-02-06 LAB — CBC WITH DIFFERENTIAL
Lymphocytes Absolute: 3.5 10*3/uL — ABNORMAL HIGH (ref 0.7–3.1)
Neutrophils Absolute: 5.8 10*3/uL (ref 1.4–7.0)
RBC: 4.9 x10E6/uL (ref 3.77–5.28)

## 2021-02-06 LAB — ALLERGENS W/TOTAL IGE AREA 2

## 2021-02-06 LAB — COMPREHENSIVE METABOLIC PANEL

## 2021-02-07 LAB — CBC WITH DIFFERENTIAL: MCHC: 31.9 g/dL (ref 31.5–35.7)

## 2021-02-07 LAB — ALLERGENS W/TOTAL IGE AREA 2

## 2021-02-07 LAB — COMPREHENSIVE METABOLIC PANEL
Chloride: 99 mmol/L (ref 96–106)
Globulin, Total: 3 g/dL (ref 1.5–4.5)

## 2021-02-09 LAB — COMPREHENSIVE METABOLIC PANEL: Alkaline Phosphatase: 140 IU/L — ABNORMAL HIGH (ref 44–121)

## 2021-02-09 LAB — CBC WITH DIFFERENTIAL: Immature Grans (Abs): 0 10*3/uL (ref 0.0–0.1)

## 2021-02-10 LAB — ALLERGENS W/TOTAL IGE AREA 2
Alternaria Alternata IgE: <0.1 kU/L
Cladosporium Herbarum IgE: <0.1 kU/L
Common Silver Birch IgE: <0.1 kU/L
Maple/Box Elder IgE: <0.1 kU/L
Oak, White IgE: <0.1 kU/L

## 2021-02-10 LAB — ALPHA-GAL PANEL: IgE (Immunoglobulin E), Serum: 16 IU/mL (ref 6–495)

## 2021-02-10 LAB — COMPREHENSIVE METABOLIC PANEL
Albumin: 4.6 g/dL (ref 3.8–4.8)
GFR calc non Af Amer: 65 mL/min/{1.73_m2} (ref >59)

## 2021-02-27 LAB — THYROID ANTIBODIES
Thyroglobulin Antibody: <1 IU/mL (ref 0.0–0.9)
Thyroperoxidase Ab SerPl-aCnc: 102 IU/mL — ABNORMAL HIGH (ref 0–34)

## 2021-02-27 LAB — CBC WITH DIFFERENTIAL
Basophils Absolute: 0 10*3/uL (ref 0.0–0.2)
Basos: 0 %
EOS (ABSOLUTE): 0.1 10*3/uL (ref 0.0–0.4)
Eos: 1 %
Hematocrit: 41.1 % (ref 34.0–46.6)
Hemoglobin: 13.1 g/dL (ref 11.1–15.9)
Immature Granulocytes: 0 %
Lymphs: 35 %
MCH: 26.7 pg (ref 26.6–33.0)
MCV: 84 fL (ref 79–97)
Monocytes Absolute: 0.5 10*3/uL (ref 0.1–0.9)
Monocytes: 5 %
Neutrophils: 59 %
RDW: 12.6 % (ref 11.7–15.4)
WBC: 9.9 10*3/uL (ref 3.4–10.8)

## 2021-02-27 LAB — ALLERGENS W/TOTAL IGE AREA 2
Bermuda Grass IgE: <0.1 kU/L
Cat Dander IgE: <0.1 kU/L
Cedar, Mountain IgE: <0.1 kU/L
Cockroach, German IgE: <0.1 kU/L
Cottonwood IgE: <0.1 kU/L
D Farinae IgE: <0.1 kU/L
D Pteronyssinus IgE: <0.1 kU/L
Dog Dander IgE: <0.1 kU/L
Johnson Grass IgE: <0.1 kU/L
Pecan, Hickory IgE: <0.1 kU/L
Penicillium Chrysogen IgE: <0.1 kU/L
Pigweed, Rough IgE: <0.1 kU/L
Ragweed, Short IgE: <0.1 kU/L
Timothy Grass IgE: <0.1 kU/L
White Mulberry IgE: <0.1 kU/L

## 2021-02-27 LAB — ALPHA-GAL PANEL
Allergen Lamb IgE: <0.1 kU/L
Beef IgE: <0.1 kU/L
O215-IgE Alpha-Gal: <0.1 kU/L
Pork IgE: <0.1 kU/L

## 2021-02-27 LAB — COMPREHENSIVE METABOLIC PANEL
ALT: 15 IU/L (ref 0–32)
Albumin/Globulin Ratio: 1.5 (ref 1.2–2.2)
BUN/Creatinine Ratio: 22 (ref 12–28)
BUN: 20 mg/dL (ref 8–27)
Bilirubin Total: <0.2 mg/dL (ref 0.0–1.2)
CO2: 18 mmol/L — ABNORMAL LOW (ref 20–29)
Calcium: 9.6 mg/dL (ref 8.7–10.3)
Creatinine, Ser: 0.93 mg/dL (ref 0.57–1.00)
GFR calc Af Amer: 75 mL/min/{1.73_m2} (ref >59)
Glucose: 85 mg/dL (ref 65–99)
Potassium: 4 mmol/L (ref 3.5–5.2)
Sodium: 141 mmol/L (ref 134–144)
Total Protein: 7.6 g/dL (ref 6.0–8.5)

## 2021-02-27 LAB — CHRONIC URTICARIA: cu index: 2.3 (ref <10)

## 2021-02-27 LAB — TRYPTASE: Tryptase: 3.7 ug/L (ref 2.2–13.2)

## 2021-03-23 ENCOUNTER — Telehealth: Payer: Self-pay | Admitting: Allergy

## 2021-03-23 NOTE — Telephone Encounter (Signed)
Please call patient to go over lab results as she called again.

## 2021-03-23 NOTE — Telephone Encounter (Signed)
Pt was returning a call that was  received  on 3/21 to go over test results.

## 2021-03-23 NOTE — Telephone Encounter (Signed)
Lm for pt to call us back about results

## 2021-05-08 ENCOUNTER — Ambulatory Visit: Payer: BC Managed Care – PPO | Admitting: Allergy

## 2021-06-04 ENCOUNTER — Ambulatory Visit: Payer: BC Managed Care – PPO | Admitting: Allergy

## 2021-06-04 DIAGNOSIS — J309 Allergic rhinitis, unspecified: Secondary | ICD-10-CM

## 2022-05-03 ENCOUNTER — Ambulatory Visit: Payer: Self-pay | Attending: Family

## 2022-05-03 DIAGNOSIS — Z23 Encounter for immunization: Secondary | ICD-10-CM

## 2022-05-04 NOTE — Progress Notes (Signed)
? ?  Covid-19 Vaccination Clinic ? ?Name:  Kristin Galloway    ?MRN: [id number] ?DOB: [birth year] ? ?05/04/2022 ? ?Kristin Galloway was observed post Covid-19 immunization for 15 minutes without incident. She was provided with Vaccine Information Sheet and instruction to access the V-Safe system.  ? ?Kristin Galloway was instructed to call 911 with any severe reactions post vaccine: ?Difficulty breathing  ?Swelling of face and throat  ?A fast heartbeat  ?A bad rash all over body  ?Dizziness and weakness  ? ?Immunizations Administered   ? ? Name Date Dose VIS Date Route  ? Moderna Covid-19 vaccine Bivalent Booster 05/03/2022 11:00 AM 0.5 mL 08/01/2021 Intramuscular  ? Manufacturer: Moderna  ? Lot: [id number]  ? [place]: 06770-340-35  ? ?  ?  ?

## 2022-09-02 ENCOUNTER — Ambulatory Visit
Admission: RE | Admit: 2022-09-02 | Discharge: 2022-09-02 | Disposition: A | Discharge status: Home / Self Care | Payer: BC Managed Care – PPO | Source: Ambulatory Visit | Attending: Family Medicine | Admitting: Family Medicine

## 2022-09-02 ENCOUNTER — Other Ambulatory Visit: Payer: Self-pay | Admitting: Family Medicine

## 2022-09-02 DIAGNOSIS — M25562 Pain in left knee: Secondary | ICD-10-CM

## 2023-09-14 ENCOUNTER — Other Ambulatory Visit: Payer: Self-pay | Admitting: Family Medicine

## 2023-09-14 ENCOUNTER — Ambulatory Visit
Admission: RE | Admit: 2023-09-14 | Discharge: 2023-09-14 | Disposition: A | Discharge status: Home / Self Care | Payer: BC Managed Care – PPO | Source: Ambulatory Visit | Attending: Family Medicine | Admitting: Family Medicine

## 2023-09-14 DIAGNOSIS — R0602 Shortness of breath: Secondary | ICD-10-CM
